# Patient Record
Sex: Male | Born: 1985 | Hispanic: Yes | Marital: Single | Smoking: Former smoker
Health system: Southern US, Community
[De-identification: ages and names within clinical notes are randomized; demographics above are authoritative.]

## PROBLEM LIST (undated history)

## (undated) DIAGNOSIS — E162 Hypoglycemia, unspecified: Secondary | ICD-10-CM

---

## 2012-08-14 ENCOUNTER — Emergency Department (HOSPITAL_COMMUNITY)
Admission: EM | Admit: 2012-08-14 | Discharge: 2012-08-14 | Disposition: A | Discharge status: Home / Self Care | Payer: Self-pay | Attending: Emergency Medicine | Admitting: Emergency Medicine

## 2012-08-14 ENCOUNTER — Encounter (HOSPITAL_COMMUNITY): Payer: Self-pay | Admitting: Emergency Medicine

## 2012-08-14 DIAGNOSIS — R509 Fever, unspecified: Secondary | ICD-10-CM | POA: Insufficient documentation

## 2012-08-14 DIAGNOSIS — R05 Cough: Secondary | ICD-10-CM | POA: Insufficient documentation

## 2012-08-14 DIAGNOSIS — J029 Acute pharyngitis, unspecified: Secondary | ICD-10-CM | POA: Insufficient documentation

## 2012-08-14 DIAGNOSIS — R059 Cough, unspecified: Secondary | ICD-10-CM | POA: Insufficient documentation

## 2012-08-14 DIAGNOSIS — R1310 Dysphagia, unspecified: Secondary | ICD-10-CM | POA: Insufficient documentation

## 2012-08-14 DIAGNOSIS — J019 Acute sinusitis, unspecified: Secondary | ICD-10-CM | POA: Insufficient documentation

## 2012-08-14 DIAGNOSIS — IMO0001 Reserved for inherently not codable concepts without codable children: Secondary | ICD-10-CM | POA: Insufficient documentation

## 2012-08-14 DIAGNOSIS — R599 Enlarged lymph nodes, unspecified: Secondary | ICD-10-CM | POA: Insufficient documentation

## 2012-08-14 HISTORY — DX: Hypoglycemia, unspecified: E16.2

## 2012-08-14 LAB — RAPID STREP SCREEN (MED CTR MEBANE ONLY): Streptococcus, Group A Screen (Direct): NEGATIVE

## 2012-08-14 MED ORDER — DEXAMETHASONE SODIUM PHOSPHATE 10 MG/ML IJ SOLN
10.0000 mg | Freq: Once | INTRAMUSCULAR | Status: AC
  Administered 2012-08-14: 10 mg via INTRAMUSCULAR
  Filled 2012-08-14: qty 1

## 2012-08-14 MED ORDER — HYDROCODONE-HOMATROPINE 5-1.5 MG/5ML PO SYRP: 2.5000 mL | ORAL_SOLUTION | Freq: Four times a day (QID) | ORAL | Status: DC | PRN

## 2012-08-14 MED ORDER — AZITHROMYCIN 250 MG PO TABS: ORAL_TABLET | ORAL | Status: DC

## 2012-08-14 NOTE — ED Notes (Signed)
Pt reports 2 week hx of sinus drainage, sore throat and cough. 2 day hx of eye drainage. Denies ear pain

## 2012-08-14 NOTE — ED Provider Notes (Signed)
History     CSN: [id number]  Arrival date & time 08/14/12  1236   First MD Initiated Contact with Patient 08/14/12 1252      Chief Complaint  Patient presents with  . Nasal Congestion    x 2 weeks  . Cough    (Consider location/radiation/quality/duration/timing/severity/associated sxs/prior treatment) HPI 27 year old male presents the emergency department with chief complaint of sinusitis.  Patient states that he developed flulike symptoms approximately 2 weeks ago with sore throat, body aches chills, subjective fever, nasal congestion, cough.  Over the past 2 days he has had increasing the worst pain in his face and nasal cavity.  He also has a very sore throat and swollen tissues with odynophagia.  Patient has subjective fevers and chills.  He is decreased activity level.   No past medical history on file.  No past surgical history on file.  No family history on file.  History  Substance Use Topics  . Smoking status: Not on file  . Smokeless tobacco: Not on file  . Alcohol Use: Not on file      Review of Systems Ten systems reviewed and are negative for acute change, except as noted in the HPI.   Allergies  Penicillins  Home Medications   Current Outpatient Rx  Name  Route  Sig  Dispense  Refill  . PSEUDOEPH-DOXYLAMINE-DM-APAP 60-7.12-05-998 MG/30ML PO LIQD   Oral   Take 30 mLs by mouth at bedtime as needed. Cold/flu         . PSEUDOEPHEDRINE HCL 30 MG PO TABS   Oral   Take 30 mg by mouth every 4 (four) hours as needed. Nasal congestion         . PSEUDOEPHEDRINE-APAP-DM 11-914-78 MG/30ML PO LIQD   Oral   Take 30 mLs by mouth every 6 (six) hours as needed. Cold/flu           There were no vitals taken for this visit.  Physical Exam  Physical Exam  Nursing note and vitals reviewed. Constitutional: He appears well-developed and well-nourished. No distress.  HENT:  Head: Normocephalic and atraumatic.  Eyes: Conjunctivae normal are normal. No  scleral icterus.  Ears: TMs normal Neck: Normal range of motion. Neck supple.  tender anterior cervical lymphadenopathy Throat: Edematous uvula midline.  Erythematous.  No exudates.  Airway patent. Cardiovascular: Normal rate, regular rhythm and normal heart sounds.   Pulmonary/Chest: Effort normal and breath sounds normal. No respiratory distress.  Abdominal: Soft. There is no tenderness.  Musculoskeletal: He exhibits no edema.  Neurological: He is alert.  Skin: Skin is warm and dry. He is not diaphoretic.  Psychiatric: His behavior is normal.    ED Course  Procedures (including critical care time)  Labs Reviewed - No data to display No results found.   1. Acute sinusitis       MDM  1:25 PM BP 121/91  Pulse 92  Temp 98.3 F (36.8 C)  Resp 18  SpO2 100% Sig with likely bacterial sinusitis 2 length of symptoms. Strep throat swab pending at this time .   2:04 PM BP 121/91  Pulse 92  Temp 98.3 F (36.8 C)  Resp 18  SpO2 100% MDM Number of Diagnoses or Management Options Acute sinusitis:     With bacterial sinusitis.  Given Decadron to decrease swelling of the pharyngeal tissues.  Precautions discussed.  Treated with Z-Pak.      Arthor Captain, PA-C 08/14/12 1406

## 2012-08-14 NOTE — ED Provider Notes (Signed)
Medical screening examination/treatment/procedure(s) were performed by non-physician practitioner and as supervising physician I was immediately available for consultation/collaboration.  Ethelda Chick, MD 08/14/12 1414

## 2012-12-15 ENCOUNTER — Emergency Department (HOSPITAL_COMMUNITY)
Admission: EM | Admit: 2012-12-15 | Discharge: 2012-12-15 | Disposition: A | Discharge status: Home / Self Care | Payer: 59 | Attending: Emergency Medicine | Admitting: Emergency Medicine

## 2012-12-15 ENCOUNTER — Emergency Department (HOSPITAL_COMMUNITY): Payer: 59

## 2012-12-15 ENCOUNTER — Encounter (HOSPITAL_COMMUNITY): Payer: Self-pay | Admitting: Emergency Medicine

## 2012-12-15 DIAGNOSIS — Y929 Unspecified place or not applicable: Secondary | ICD-10-CM | POA: Insufficient documentation

## 2012-12-15 DIAGNOSIS — Z8639 Personal history of other endocrine, nutritional and metabolic disease: Secondary | ICD-10-CM | POA: Insufficient documentation

## 2012-12-15 DIAGNOSIS — W010XXA Fall on same level from slipping, tripping and stumbling without subsequent striking against object, initial encounter: Secondary | ICD-10-CM | POA: Insufficient documentation

## 2012-12-15 DIAGNOSIS — Z862 Personal history of diseases of the blood and blood-forming organs and certain disorders involving the immune mechanism: Secondary | ICD-10-CM | POA: Insufficient documentation

## 2012-12-15 DIAGNOSIS — F17200 Nicotine dependence, unspecified, uncomplicated: Secondary | ICD-10-CM | POA: Insufficient documentation

## 2012-12-15 DIAGNOSIS — M25562 Pain in left knee: Secondary | ICD-10-CM

## 2012-12-15 DIAGNOSIS — Y9389 Activity, other specified: Secondary | ICD-10-CM | POA: Insufficient documentation

## 2012-12-15 DIAGNOSIS — S8990XA Unspecified injury of unspecified lower leg, initial encounter: Secondary | ICD-10-CM | POA: Insufficient documentation

## 2012-12-15 MED ORDER — HYDROCODONE-ACETAMINOPHEN 5-325 MG PO TABS: 1.0000 | ORAL_TABLET | ORAL | Status: DC | PRN
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
## 2012-12-15 NOTE — ED Provider Notes (Signed)
History     CSN: [id number]  Arrival date & time 12/15/12  2029   First MD Initiated Contact with Patient 12/15/12 2039      Chief Complaint  Patient presents with  . Knee Pain    (Consider location/radiation/quality/duration/timing/severity/associated sxs/prior treatment) Patient is a 27 y.o. male presenting with knee pain. The history is provided by the patient. No language interpreter was used.  Knee Pain Location:  Knee Injury: yes   Mechanism of injury: fall   Knee location:  L knee Pain details:    Quality:  Aching   Radiates to:  Does not radiate   Severity:  Moderate   Onset quality:  Gradual   Duration:  1 day Chronicity:  New Dislocation: no   Foreign body present:  No foreign bodies Prior injury to area:  No Worsened by:  Nothing tried Ineffective treatments:  None tried Associated symptoms: swelling   Associated symptoms: no back pain, no muscle weakness, no numbness and no stiffness     Past Medical History  Diagnosis Date  . Hypoglycemia   . Hypoglycemia     History reviewed. No pertinent past surgical history.  Family History  Problem Relation Age of Onset  . Hypertension Mother   . Diabetes Mother   . Hypertension Father   . Diabetes Father     History  Substance Use Topics  . Smoking status: Current Some Day Smoker  . Smokeless tobacco: Not on file  . Alcohol Use: Yes      Review of Systems  Constitutional: Negative.   Respiratory: Negative.   Cardiovascular: Negative.   Musculoskeletal: Negative for back pain and stiffness.    Allergies  Penicillins  Home Medications  No current outpatient prescriptions on file.  BP 125/65  Pulse 75  Temp(Src) 98.3 F (36.8 C) (Oral)  Resp 14  SpO2 100%  Physical Exam  Nursing note and vitals reviewed. Constitutional: He is oriented to person, place, and time. He appears well-developed and well-nourished.  Cardiovascular: Normal rate and regular rhythm.   Pulmonary/Chest: Effort  normal and breath sounds normal.  Musculoskeletal: Normal range of motion.  Pt has obvious swelling to the medial aspect of the left knee:pt has full rom  Neurological: He is alert and oriented to person, place, and time.  Skin: Skin is warm and dry.    ED Course  Procedures (including critical care time)  Labs Reviewed - No data to display Dg Knee Complete 4 Views Left  12/15/2012   *RADIOLOGY REPORT*  Clinical Data: Pain post fall.  LEFT KNEE - COMPLETE 4+ VIEW  Comparison: None.  Findings: No effusion. Negative for fracture, dislocation, or other acute abnormality.  Normal alignment and mineralization. No significant degenerative change.  Regional soft tissues unremarkable.  IMPRESSION:  Negative   Original Report Authenticated By: D. Andria Rhein, MD     1. Knee pain, left       MDM  Pt has no acute bony abnormality:pt is okay to follow up as needed        Teressa Lower, NP 12/15/12 2144

## 2012-12-16 NOTE — ED Provider Notes (Signed)
Medical screening examination/treatment/procedure(s) were performed by non-physician practitioner and as supervising physician I was immediately available for consultation/collaboration.   Lyndsy Gilberto, MD 12/16/12 0218 

## 2013-02-03 ENCOUNTER — Emergency Department (INDEPENDENT_AMBULATORY_CARE_PROVIDER_SITE_OTHER): Payer: 59

## 2013-02-03 ENCOUNTER — Encounter (HOSPITAL_COMMUNITY): Payer: Self-pay | Admitting: Emergency Medicine

## 2013-02-03 ENCOUNTER — Emergency Department (INDEPENDENT_AMBULATORY_CARE_PROVIDER_SITE_OTHER)
Admission: EM | Admit: 2013-02-03 | Discharge: 2013-02-03 | Disposition: A | Discharge status: Home / Self Care | Payer: 59 | Source: Home / Self Care | Attending: Family Medicine | Admitting: Family Medicine

## 2013-02-03 DIAGNOSIS — S139XXA Sprain of joints and ligaments of unspecified parts of neck, initial encounter: Secondary | ICD-10-CM

## 2013-02-03 DIAGNOSIS — S134XXA Sprain of ligaments of cervical spine, initial encounter: Secondary | ICD-10-CM

## 2013-02-03 MED ORDER — CYCLOBENZAPRINE HCL 10 MG PO TABS: 10.0000 mg | ORAL_TABLET | Freq: Two times a day (BID) | ORAL | Status: DC | PRN

## 2013-02-03 MED ORDER — HYDROCODONE-ACETAMINOPHEN 5-325 MG PO TABS: 1.0000 | ORAL_TABLET | Freq: Four times a day (QID) | ORAL | Status: DC | PRN

## 2013-02-03 MED ORDER — NAPROXEN 500 MG PO TABS: 500.0000 mg | ORAL_TABLET | Freq: Two times a day (BID) | ORAL | Status: DC
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
## 2013-02-03 NOTE — ED Provider Notes (Signed)
Medical screening examination/treatment/procedure(s) were performed by resident physician or non-physician practitioner and as supervising physician I was immediately available for consultation/collaboration.   Lamarr Feenstra DOUGLAS MD.   Nyal Schachter D Marian Grandt, MD 02/03/13 2110 

## 2013-04-01 ENCOUNTER — Encounter (HOSPITAL_COMMUNITY): Payer: Self-pay | Admitting: Emergency Medicine

## 2013-04-01 ENCOUNTER — Emergency Department (HOSPITAL_COMMUNITY)
Admission: EM | Admit: 2013-04-01 | Discharge: 2013-04-01 | Disposition: A | Discharge status: Home / Self Care | Payer: 59 | Attending: Emergency Medicine | Admitting: Emergency Medicine

## 2013-04-01 DIAGNOSIS — Z791 Long term (current) use of non-steroidal anti-inflammatories (NSAID): Secondary | ICD-10-CM | POA: Insufficient documentation

## 2013-04-01 DIAGNOSIS — J069 Acute upper respiratory infection, unspecified: Secondary | ICD-10-CM | POA: Insufficient documentation

## 2013-04-01 DIAGNOSIS — Z862 Personal history of diseases of the blood and blood-forming organs and certain disorders involving the immune mechanism: Secondary | ICD-10-CM | POA: Insufficient documentation

## 2013-04-01 DIAGNOSIS — F17200 Nicotine dependence, unspecified, uncomplicated: Secondary | ICD-10-CM | POA: Insufficient documentation

## 2013-04-01 DIAGNOSIS — Z8639 Personal history of other endocrine, nutritional and metabolic disease: Secondary | ICD-10-CM | POA: Insufficient documentation

## 2013-04-01 DIAGNOSIS — Z880 Allergy status to penicillin: Secondary | ICD-10-CM | POA: Insufficient documentation

## 2013-04-01 MED ORDER — ACETAMINOPHEN 325 MG PO TABS
650.0000 mg | ORAL_TABLET | Freq: Once | ORAL | Status: AC
  Administered 2013-04-01: 650 mg via ORAL
  Filled 2013-04-01: qty 2

## 2013-04-01 MED ORDER — GUAIFENESIN-DM 100-10 MG/5ML PO SYRP: 5.0000 mL | ORAL_SOLUTION | Freq: Three times a day (TID) | ORAL | Status: DC | PRN

## 2013-04-01 MED ORDER — IBUPROFEN 800 MG PO TABS
800.0000 mg | ORAL_TABLET | Freq: Once | ORAL | Status: AC
  Administered 2013-04-01: 800 mg via ORAL
  Filled 2013-04-01: qty 1

## 2013-04-01 NOTE — ED Provider Notes (Signed)
CSN: [id number]     Arrival date & time 04/01/13  1447 History  This chart was scribed for non-physician practitioner Jillyn Ledger, PA-C working with Ward Givens, MD by Valera Castle, ED scribe. This patient was seen in room WTR7/WTR7 and the patient's care was started at 4:01 PM.    Chief Complaint  Patient presents with  . URI    Patient is a 27 y.o. male presenting with URI. The history is provided by the patient. No language interpreter was used.  URI Presenting symptoms: congestion, cough, fever (subjective), rhinorrhea and sore throat   Presenting symptoms: no ear pain and no fatigue   Severity:  Moderate Onset quality:  Sudden Duration:  2 days Timing:  Constant Progression:  Worsening Chronicity:  New Associated symptoms: no headaches, no neck pain and no wheezing    HPI Comments: Johnny Montoya is a 27 y.o. male with a h/o hypoglycemia who presents to the Emergency Department complaining of a URI, onset 2 days ago with associated nasal congestion, subjective fever, and sore throat. He reports associated unproductive dry cough with no wheezing or SOB.  He describes a "scratchy throat" and denies any drooling, difficulty breathing/swallowing.  He denies taking a temperature for his fever. He reports having taken Ibuprofen, with no relief. He denies having a h/o asthma or other breathing problems. He denies anyone in proximity to him being ill. He denies chest pain, abdominal pain, ear pain, emesis, nausea, diarhea, and any other associated symptoms. He reports being an occasional smoker. He has an allergy to Penicillin, and denies any other medical history. He denies having a PCP.   Past Medical History  Diagnosis Date  . Hypoglycemia   . Hypoglycemia    History reviewed. No pertinent past surgical history. Family History  Problem Relation Age of Onset  . Hypertension Mother   . Diabetes Mother   . Hypertension Father   . Diabetes Father    History  Substance Use  Topics  . Smoking status: Current Some Day Smoker  . Smokeless tobacco: Not on file  . Alcohol Use: Yes    Review of Systems  Constitutional: Positive for fever (subjective). Negative for chills, activity change, appetite change and fatigue.  HENT: Positive for congestion, sore throat and rhinorrhea. Negative for ear pain, facial swelling, mouth sores, neck pain, neck stiffness and sinus pressure.   Eyes: Negative for visual disturbance.  Respiratory: Positive for cough. Negative for shortness of breath and wheezing.   Cardiovascular: Negative for chest pain and leg swelling.  Gastrointestinal: Negative for nausea, vomiting, abdominal pain, diarrhea and constipation.  Genitourinary: Negative for dysuria.  Musculoskeletal: Negative for back pain.  Skin: Negative for rash and wound.  Neurological: Negative for dizziness, weakness, light-headedness and headaches.  All other systems reviewed and are negative.    Allergies  Penicillins  Home Medications   Current Outpatient Rx  Name  Route  Sig  Dispense  Refill  . cyclobenzaprine (FLEXERIL) 10 MG tablet   Oral   Take 1 tablet (10 mg total) by mouth 2 (two) times daily as needed.   20 tablet   0   . HYDROcodone-acetaminophen (NORCO) 5-325 MG per tablet   Oral   Take 1 tablet by mouth every 6 (six) hours as needed for pain.   20 tablet   0   . HYDROcodone-acetaminophen (NORCO/VICODIN) 5-325 MG per tablet   Oral   Take 1 tablet by mouth every 4 (four) hours as needed for pain.  6 tablet   0   . ibuprofen (ADVIL,MOTRIN) 200 MG tablet   Oral   Take 200 mg by mouth every 6 (six) hours as needed for pain.         . naproxen (NAPROSYN) 500 MG tablet   Oral   Take 1 tablet (500 mg total) by mouth 2 (two) times daily.   30 tablet   0    Triage Vitals: BP 120/78  Temp(Src) 99.3 F (37.4 C) (Oral)  Resp 18  SpO2 96%  Filed Vitals:   04/01/13 1501 04/01/13 1629  BP: 120/78 113/70  Pulse:  90  Temp: 99.3 F (37.4  C)   TempSrc: Oral   Resp: 18 18  SpO2: 96% 98%     Physical Exam  Nursing note and vitals reviewed. Constitutional: He is oriented to person, place, and time. He appears well-developed and well-nourished. No distress.  HENT:  Head: Normocephalic and atraumatic.  Right Ear: External ear normal.  Left Ear: External ear normal.  Nose: Nose normal.  Mouth/Throat: Oropharynx is clear and moist. No oropharyngeal exudate.  TM's gray and translucent bilaterally.  Rhinorrhea. No trismus.  Uvula midline.    Eyes: Conjunctivae and EOM are normal. Pupils are equal, round, and reactive to light. Right eye exhibits no discharge. Left eye exhibits no discharge.  Neck: Normal range of motion. Neck supple. No tracheal deviation present.  No LAD, tenderness, or edema throughout the neck   Cardiovascular: Normal rate, regular rhythm, normal heart sounds and intact distal pulses.  Exam reveals no gallop and no friction rub.   No murmur heard. Pulmonary/Chest: Effort normal and breath sounds normal. No respiratory distress. He has no wheezes. He has no rales. He exhibits no tenderness.  Patient coughing throughout exam   Abdominal: Soft. Bowel sounds are normal. There is no tenderness.  Musculoskeletal: Normal range of motion. He exhibits no edema.  Neurological: He is alert and oriented to person, place, and time.  Skin: Skin is warm and dry. He is not diaphoretic.  Psychiatric: He has a normal mood and affect. His behavior is normal.    ED Course  Procedures (including critical care time)  DIAGNOSTIC STUDIES: Oxygen Saturation is 96% on room air, normal by my interpretation.    COORDINATION OF CARE: 4:07 PM-Discussed treatment plan with pt at bedside and pt agreed to plan. Recommended pt to get nasal decongestants. Will give pt a script for stronger cough medicine and give him a note for work.    Labs Review Labs Reviewed - No data to display Imaging Review No results found.  MDM  No  diagnosis found.  Johnny Montoya is a 27 y.o. male with a h/o hypoglycemia who presents to the Emergency Department complaining of a URI, onset 2 days ago with associated nasal congestion, subjective fever, and sore throat. Ibuprofen and Tylenol ordered for symptomatic relief.      Etiology of symptoms is likely due to a URI.  Patient was prescribed Robitussin for outpatient management.  Patient was non-toxic in appearance and afebrile.  He was instructed to follow-up with a PCP if his symptoms do not improve or worsen.  Patient provided resource guide.  Patient was instructed to return to the ED if they experience any fever, hemoptysis, stiff neck, SOB, or other concerns.  Patient was in agreement with discharge and plan.     Final impressions: 1. Upper respiratory infection    Thomasenia Sales    I personally performed the services  described in this documentation, which was scribed in my presence. The recorded information has been reviewed and is accurate.    Jillyn Ledger, PA-C 04/03/13 1219

## 2013-04-01 NOTE — ED Notes (Signed)
Per pt, cold symptoms for 2 days, nasal congestion, fever, sore throat

## 2013-04-04 NOTE — ED Provider Notes (Signed)
Medical screening examination/treatment/procedure(s) were performed by non-physician practitioner and as supervising physician I was immediately available for consultation/collaboration. Devoria Albe, MD, Armando Gang   Ward Givens, MD 04/04/13 [phone]

## 2013-06-10 ENCOUNTER — Ambulatory Visit (INDEPENDENT_AMBULATORY_CARE_PROVIDER_SITE_OTHER): Payer: 59 | Admitting: Physician Assistant

## 2013-06-10 VITALS — BP 118/76 | HR 80 | Temp 98.1°F | Resp 18 | Ht 66.0 in | Wt 138.6 lb

## 2013-06-10 DIAGNOSIS — Z880 Allergy status to penicillin: Secondary | ICD-10-CM

## 2013-06-10 DIAGNOSIS — Z113 Encounter for screening for infections with a predominantly sexual mode of transmission: Secondary | ICD-10-CM

## 2013-06-10 DIAGNOSIS — E162 Hypoglycemia, unspecified: Secondary | ICD-10-CM

## 2013-06-10 LAB — GLUCOSE, POCT (MANUAL RESULT ENTRY): POC Glucose: 84 mg/dL (ref 70–99)

## 2013-06-10 NOTE — Patient Instructions (Signed)
I will let you know when your labs are back and if we need to treat anything.  Use condoms with EVERY sexual encounter - this is the only way to protect yourself from infection.   Safe Sex Safe sex is about reducing the risk of giving or getting a sexually transmitted disease (STD). STDs are spread through sexual contact involving the genitals, mouth, or rectum. Some STDS can be cured and others cannot. Safe sex can also prevent unintended pregnancies.  SAFE SEX PRACTICES  Limit your sexual activity to only one partner who is only having sex with you.  Talk to your partner about their past partners, past STDs, and drug use.  Use a condom every time you have sexual intercourse. This includes vaginal, oral, and anal sexual activity. Both females and males should wear condoms during oral sex. Only use latex or polyurethane condoms and water-based lubricants. Petroleum-based lubricants or oils used to lubricate a condom will weaken the condom and increase the chance that it will break. The condom should be in place from the beginning to the end of sexual activity. Wearing a condom reduces, but does not completely eliminate, your risk of getting or giving a STD. STDs can be spread by contact with skin of surrounding areas.  Get vaccinated for hepatitis B and HPV.  Avoid alcohol and recreational drugs which can affect your judgement. You may forget to use a condom or participate in high-risk sex.  For females, avoid douching after sexual intercourse. Douching can spread an infection farther into the reproductive tract.  Check your body for signs of sores, blisters, rashes, or unusual discharge. See your caregiver if you notice any of these signs.  Avoid sexual contact if you have symptoms of an infection or are being treated for an STD. If you or your partner has herpes, avoid sexual contact when blisters are present. Use condoms at all other times.  See your caregiver for regular screenings,  examinations, and tests for STDs. Before having sex with a new partner, each of you should be screened for STDs and talk about the results with your partner. BENEFITS OF SAFE SEX   There is less of a chance of getting or giving an STD.  You can prevent unwanted or unintended pregnancies.  By discussing safer sex concerns with your partner, you may increase feelings of intimacy, comfort, trust, and honesty between the both of you. Document Released: 08/02/2004 Document Revised: 03/19/2012 Document Reviewed: 12/17/2011 [facility] Patient Information 2014 [place], [place].

## 2013-06-10 NOTE — Progress Notes (Signed)
   Subjective:    Patient ID: Johnny Montoya, male    DOB: 10-30-1985, 27 y.o.   MRN: [id number]  HPI   Johnny Montoya is a pleasant 27 yr old male here requesting STD testing.  He denies symptoms including penile discharge, dysuria, skin lesions.  He was last tested in July 2014, all negative.  Currently sexually active with 1 male partner.  Has been with this partner for 2 months.  They do not use condoms.  Last urinated 1-2 hours ago.  Additionally he is concerned for hypoglycemia.  Reports a history of this.  He would like is blood sugar checked today.  He is also interested in allergy testing to see if he is still allergic to penicillin   Review of Systems  Constitutional: Negative for fever and chills.  Respiratory: Negative.   Cardiovascular: Negative.   Gastrointestinal: Negative.   Genitourinary: Negative for dysuria, discharge, genital sores and testicular pain.  Musculoskeletal: Negative.   Skin: Negative.        Objective:   Physical Exam  Vitals reviewed. Constitutional: He is oriented to person, place, and time. He appears well-developed and well-nourished. No distress.  HENT:  Head: Normocephalic and atraumatic.  Eyes: Conjunctivae are normal. No scleral icterus.  Pulmonary/Chest: Effort normal.  Neurological: He is alert and oriented to person, place, and time.  Skin: Skin is warm and dry.  Psychiatric: He has a normal mood and affect. His behavior is normal.    Results for orders placed in visit on 06/10/13  GLUCOSE, POCT (MANUAL RESULT ENTRY)      Result Value Range   POC Glucose 84  70 - 99 mg/dl       Assessment & Plan:  Screen for STD (sexually transmitted disease) - Plan: GC/Chlamydia Probe Amp, RPR, HIV antibody  Hypoglycemia - Plan: POCT glucose (manual entry), Ambulatory referral to Endocrinology  Penicillin allergy - Plan: Ambulatory referral to Allergy  Johnny Montoya is a 27 yr old male here for STD testing - uriprobe, HIV, RPR sent.  He has  concern for hypoglycemia.  Blood glucose today is normal.  He would like to be further evaluated for hypoglycemia with formal testing - I have referred him to endocrinology for this.  Additionally he would like testing to see if he is allergic to penicillin - I have referred him to allergy for this.  Will follow up on labs and treat if necessary.  Pt to RTC as needs arise.   Loleta Dicker MHS, PA-C [facility] 12/3/20145:20 PM

## 2013-06-11 LAB — HIV ANTIBODY (ROUTINE TESTING W REFLEX): HIV: NONREACTIVE

## 2013-06-11 LAB — GC/CHLAMYDIA PROBE AMP: CT Probe RNA: NEGATIVE

## 2013-07-28 ENCOUNTER — Encounter: Payer: Self-pay | Admitting: *Deleted

## 2013-10-25 ENCOUNTER — Ambulatory Visit (INDEPENDENT_AMBULATORY_CARE_PROVIDER_SITE_OTHER): Payer: 59 | Admitting: Internal Medicine

## 2013-10-25 VITALS — BP 100/60 | HR 75 | Temp 98.0°F | Resp 16 | Ht 65.0 in | Wt 135.0 lb

## 2013-10-25 DIAGNOSIS — R1013 Epigastric pain: Secondary | ICD-10-CM

## 2013-10-25 DIAGNOSIS — Z029 Encounter for administrative examinations, unspecified: Secondary | ICD-10-CM

## 2013-10-25 LAB — POCT CBC
Granulocyte percent: 85.3 %G — AB (ref 37–80)
HCT, POC: 41.9 % — AB (ref 43.5–53.7)
HEMOGLOBIN: 13.8 g/dL — AB (ref 14.1–18.1)
Lymph, poc: 1.2 (ref 0.6–3.4)
MCH: 27.7 pg (ref 27–31.2)
MCHC: 32.9 g/dL (ref 31.8–35.4)
MCV: 84.1 fL (ref 80–97)
MID (CBC): 0.4 (ref 0–0.9)
MPV: 9.6 fL (ref 0–99.8)
POC Granulocyte: 9.6 — AB (ref 2–6.9)
POC LYMPH PERCENT: 10.9 %L (ref 10–50)
POC MID %: 3.8 %M (ref 0–12)
Platelet Count, POC: 310 10*3/uL (ref 142–424)
RBC: 4.98 M/uL (ref 4.69–6.13)
RDW, POC: 13 %
WBC: 11.2 10*3/uL — AB (ref 4.6–10.2)

## 2013-10-25 MED ORDER — ONDANSETRON HCL 4 MG PO TABS: 4.0000 mg | ORAL_TABLET | Freq: Three times a day (TID) | ORAL | Status: DC | PRN

## 2013-10-25 MED ORDER — DICYCLOMINE HCL 20 MG PO TABS: 20.0000 mg | ORAL_TABLET | Freq: Four times a day (QID) | ORAL | Status: DC

## 2013-10-25 NOTE — Progress Notes (Addendum)
Subjective:    Patient ID: Johnny Montoya, male    DOB: 06/12/1986, 28 y.o.   MRN: [id number]  HPI This chart was scribed for Ellamae Siaobert Stephonie Wilcoxen, MD by Andrew Auaven Small, ED Scribe. This patient was seen in room 10 and the patient's care was started at 5:54 PM.  HPI Comments: Johnny Montoya is a 28 y.o. male who presents to the [facility] complaining of upper abdominal pain onset this morning. Pt reports that he was unable to stay at work all day  today due to intermittent nausea and emesis. Pt reports that he was unable to eat without throwing up earlier but that the nausea has resolved . At this time pt denies being nauseous, however the epigastric pain continues. Pt denies diarrhea, fever, cough, sore throat, dysuria. He has a history of dyspepsia only with spicy foods and no consistent reflux esophagitis  No current medical problems or medications   Allergies  Allergen Reactions  . Penicillins Rash    Face swells   Prior to Admission medications   Not on File   Review of Systems  Constitutional: Negative for fever and chills.  HENT: Negative for sore throat.   Respiratory: Negative for cough.   Gastrointestinal: Positive for nausea, vomiting and abdominal pain. Negative for diarrhea, constipation, blood in stool and abdominal distention.  Genitourinary: Negative for dysuria, urgency, frequency, flank pain, discharge and genital sores.       SSP with recent testing for STDs all negative/no current symptoms      Objective:   Physical Exam  Nursing note and vitals reviewed. Constitutional: He is oriented to person, place, and time. He appears well-developed and well-nourished. No distress.  HENT:  Head: Normocephalic and atraumatic.  Eyes: EOM are normal.  Neck: Neck supple.  Cardiovascular: Normal rate, regular rhythm and normal heart sounds.   No murmur heard. Pulmonary/Chest: Effort normal and breath sounds normal. No respiratory distress.  Abdominal: Soft.  Bowel sounds are normal. He exhibits no mass. There is no rebound and no guarding.  He has tenderness in the epigastric area to palpation No hepatomegaly or splenomegaly Nontender in both lower quadrants  Musculoskeletal: Normal range of motion.  Neurological: He is alert and oriented to person, place, and time.  Skin: Skin is warm and dry.  Psychiatric: He has a normal mood and affect. His behavior is normal.   Results for orders placed in visit on 10/25/13  POCT CBC      Result Value Ref Range   WBC 11.2 (*) 4.6 - 10.2 K/uL   Lymph, poc 1.2  0.6 - 3.4   POC LYMPH PERCENT 10.9  10 - 50 %L   MID (cbc) 0.4  0 - 0.9   POC MID % 3.8  0 - 12 %M   POC Granulocyte 9.6 (*) 2 - 6.9   Granulocyte percent 85.3 (*) 37 - 80 %G   RBC 4.98  4.69 - 6.13 M/uL   Hemoglobin 13.8 (*) 14.1 - 18.1 g/dL   HCT, POC 40.941.9 (*) 81.143.5 - 53.7 %   MCV 84.1  80 - 97 fL   MCH, POC 27.7  27 - 31.2 pg   MCHC 32.9  31.8 - 35.4 g/dL   RDW, POC 91.413.0     Platelet Count, POC 310  142 - 424 K/uL   MPV 9.6  0 - 99.8 fL       Assessment & Plan:  Epigastric abdominal pain with nausea Leukocytosis-mild  Will treat  with anti-medics and anti-spasmodic and followe in case he progresses to right lower quadrant pain indicating the need to rule out appendicitis Meds ordered this encounter  Medications  . dicyclomine (BENTYL) 20 MG tablet    Sig: Take 1 tablet (20 mg total) by mouth every 6 (six) hours. For abdominal pain    Dispense:  12 tablet    Refill:  0  . ondansetron (ZOFRAN) 4 MG tablet    Sig: Take 1 tablet (4 mg total) by mouth every 8 (eight) hours as needed for nausea or vomiting.    Dispense:  6 tablet    Refill:  0

## 2014-11-02 ENCOUNTER — Ambulatory Visit (INDEPENDENT_AMBULATORY_CARE_PROVIDER_SITE_OTHER): Payer: 59 | Admitting: Family Medicine

## 2014-11-02 ENCOUNTER — Ambulatory Visit (INDEPENDENT_AMBULATORY_CARE_PROVIDER_SITE_OTHER): Payer: 59

## 2014-11-02 VITALS — BP 104/60 | HR 129 | Temp 100.0°F | Resp 20 | Ht 66.0 in | Wt 138.0 lb

## 2014-11-02 DIAGNOSIS — E860 Dehydration: Secondary | ICD-10-CM

## 2014-11-02 DIAGNOSIS — G43A0 Cyclical vomiting, not intractable: Secondary | ICD-10-CM

## 2014-11-02 DIAGNOSIS — R1032 Left lower quadrant pain: Secondary | ICD-10-CM

## 2014-11-02 DIAGNOSIS — R000 Tachycardia, unspecified: Secondary | ICD-10-CM

## 2014-11-02 DIAGNOSIS — R509 Fever, unspecified: Secondary | ICD-10-CM | POA: Diagnosis not present

## 2014-11-02 DIAGNOSIS — R1115 Cyclical vomiting syndrome unrelated to migraine: Secondary | ICD-10-CM

## 2014-11-02 DIAGNOSIS — K529 Noninfective gastroenteritis and colitis, unspecified: Secondary | ICD-10-CM

## 2014-11-02 DIAGNOSIS — R319 Hematuria, unspecified: Secondary | ICD-10-CM

## 2014-11-02 DIAGNOSIS — R400 Somnolence: Secondary | ICD-10-CM | POA: Diagnosis not present

## 2014-11-02 LAB — POCT UA - MICROSCOPIC ONLY
BACTERIA, U MICROSCOPIC: NEGATIVE
CASTS, UR, LPF, POC: NEGATIVE
CRYSTALS, UR, HPF, POC: NEGATIVE
EPITHELIAL CELLS, URINE PER MICROSCOPY: NEGATIVE
MUCUS UA: POSITIVE
WBC, Ur, HPF, POC: NEGATIVE
Yeast, UA: NEGATIVE

## 2014-11-02 LAB — POCT URINALYSIS DIPSTICK
Bilirubin, UA: NEGATIVE
Glucose, UA: NEGATIVE
Ketones, UA: 80
LEUKOCYTES UA: NEGATIVE
Nitrite, UA: NEGATIVE
Protein, UA: 30
SPEC GRAV UA: 1.02
UROBILINOGEN UA: 0.2
pH, UA: 6

## 2014-11-02 LAB — COMPREHENSIVE METABOLIC PANEL
ALK PHOS: 53 U/L (ref 39–117)
ALT: 24 U/L (ref 0–53)
AST: 19 U/L (ref 0–37)
Albumin: 4.7 g/dL (ref 3.5–5.2)
BUN: 14 mg/dL (ref 6–23)
CO2: 20 mEq/L (ref 19–32)
CREATININE: 1.07 mg/dL (ref 0.50–1.35)
Calcium: 9.7 mg/dL (ref 8.4–10.5)
Chloride: 101 mEq/L (ref 96–112)
Glucose, Bld: 121 mg/dL — ABNORMAL HIGH (ref 70–99)
POTASSIUM: 4 meq/L (ref 3.5–5.3)
SODIUM: 136 meq/L (ref 135–145)
Total Bilirubin: 0.8 mg/dL (ref 0.2–1.2)
Total Protein: 7.7 g/dL (ref 6.0–8.3)

## 2014-11-02 LAB — POCT CBC
GRANULOCYTE PERCENT: 94.1 % — AB (ref 37–80)
HEMATOCRIT: 44.9 % (ref 43.5–53.7)
Hemoglobin: 14.5 g/dL (ref 14.1–18.1)
LYMPH, POC: 0.8 (ref 0.6–3.4)
MCH, POC: 26.7 pg — AB (ref 27–31.2)
MCHC: 32.2 g/dL (ref 31.8–35.4)
MCV: 82.8 fL (ref 80–97)
MID (CBC): 0.1 (ref 0–0.9)
MPV: 7.4 fL (ref 0–99.8)
POC GRANULOCYTE: 13.9 — AB (ref 2–6.9)
POC LYMPH %: 5.1 % — AB (ref 10–50)
POC MID %: 0.8 %M (ref 0–12)
Platelet Count, POC: 289 10*3/uL (ref 142–424)
RBC: 5.42 M/uL (ref 4.69–6.13)
RDW, POC: 13.8 %
WBC: 14.8 10*3/uL — AB (ref 4.6–10.2)

## 2014-11-02 LAB — POCT INFLUENZA A/B
INFLUENZA A, POC: NEGATIVE
INFLUENZA B, POC: NEGATIVE

## 2014-11-02 LAB — CK: Total CK: 173 U/L (ref 7–232)

## 2014-11-02 LAB — TSH: TSH: 0.171 u[IU]/mL — ABNORMAL LOW (ref 0.350–4.500)

## 2014-11-02 LAB — POCT SEDIMENTATION RATE: POCT SED RATE: 14 mm/h (ref 0–22)

## 2014-11-02 LAB — GLUCOSE, POCT (MANUAL RESULT ENTRY): POC GLUCOSE: 128 mg/dL — AB (ref 70–99)

## 2014-11-02 LAB — LIPASE: Lipase: 17 U/L (ref 0–75)

## 2014-11-02 MED ORDER — ACETAMINOPHEN 325 MG PO TABS
1000.0000 mg | ORAL_TABLET | Freq: Once | ORAL | Status: AC
  Administered 2014-11-02: 975 mg via ORAL

## 2014-11-02 MED ORDER — ACETAMINOPHEN 500 MG PO TABS: 1000.0000 mg | ORAL_TABLET | Freq: Three times a day (TID) | ORAL | Status: AC | PRN

## 2014-11-02 MED ORDER — ONDANSETRON 4 MG PO TBDP
8.0000 mg | ORAL_TABLET | Freq: Once | ORAL | Status: AC
  Administered 2014-11-02: 8 mg via ORAL

## 2014-11-02 MED ORDER — ONDANSETRON HCL 4 MG PO TABS: 4.0000 mg | ORAL_TABLET | Freq: Three times a day (TID) | ORAL | Status: AC | PRN

## 2014-11-02 MED ORDER — SODIUM CHLORIDE 0.9 % IV BOLUS (SEPSIS): 2000.0000 mL | Freq: Once | INTRAVENOUS | Status: AC

## 2014-11-02 NOTE — Progress Notes (Signed)
11/02/2014 at 3:14 PM  Johnny Montoya / DOB: 01/22/1986 / MRN: [id number]  The patient has SSP on his problem list.  SUBJECTIVE  Chief complaint: Abdominal Pain; Emesis; and Diarrhea   Johnny Montoya is a 29 y.o. male complaining of mild  Cramping RUQ and RLQ pain that started 1 day ago. Associated symptoms include anorexia, chills, diarrhea, fever, headache, nausea, sweats and vomiting and the patient denies hematochezia and hematemesis.  Treatments tried acetaminophen and NSAID's with poor relief. He denies eating anything abnormal and a MSM and denies any new sexual partners as well as promiscuity. He reports a history of hypoglycemia.  He works in [employer] and reports that one of his coworkers has had similar symptoms in the last week.    He  has a past medical history of Hypoglycemia; Hypoglycemia; and Hypoglycemia.    Medications reviewed and updated by myself where necessary, and exist elsewhere in the encounter.   Johnny Montoya is allergic to penicillins. He  reports that he has quit smoking. He does not have any smokeless tobacco history on file. He reports that he drinks alcohol. He reports that he does not use illicit drugs. He  has no sexual activity history on file. The patient  has no past surgical history on file.  His family history includes Diabetes in his father and mother; Hypertension in his father and mother.  Review of Systems  Constitutional: Positive for fever, chills and malaise/fatigue.  HENT: Negative for congestion.   Eyes: Negative.   Respiratory: Negative.  Negative for cough and shortness of breath.   Cardiovascular: Negative for chest pain and palpitations.  Gastrointestinal: Positive for nausea, vomiting, abdominal pain and diarrhea. Negative for heartburn, constipation and blood in stool.  Genitourinary: Negative.   Musculoskeletal: Positive for myalgias.  Skin: Negative for rash.  Neurological: Positive for dizziness. Negative for headaches.    Endo/Heme/Allergies: Negative for polydipsia.  Psychiatric/Behavioral: Negative for substance abuse.    OBJECTIVE  His  height is 5\' 6"  (1.676 m) and weight is 138 lb (62.596 kg). His oral temperature is 99.1 F (37.3 C). His blood pressure is 102/70 and his pulse is 128. His respiration is 20 and oxygen saturation is 97%.  The patient's body mass index is 22.28 kg/(m^2).  Physical Exam  Constitutional: He is oriented to person, place, and time. He appears well-developed and well-nourished. He appears lethargic.  Non-toxic appearance. He does not have a sickly appearance. He appears ill. He appears distressed.  HENT:  Right Ear: Hearing and tympanic membrane normal.  Left Ear: Hearing and tympanic membrane normal.  Nose: Nose normal. No mucosal edema.  Mouth/Throat: Uvula is midline and oropharynx is clear and moist. Mucous membranes are dry and not cyanotic.  Eyes: Conjunctivae and EOM are normal. Pupils are equal, round, and reactive to light. No scleral icterus.  Neck: Trachea normal and normal range of motion.  Cardiovascular: Regular rhythm.  Tachycardia present.  Exam reveals no gallop and no friction rub.   No murmur heard. Respiratory: Breath sounds normal. No accessory muscle usage. No respiratory distress.  GI: Normal appearance. He exhibits no distension and no mass. There is tenderness in the epigastric area, left upper quadrant and left lower quadrant. There is no rigidity, no guarding, no CVA tenderness, no tenderness at McBurney's point and negative Murphy's sign.  Musculoskeletal: Normal range of motion.  Lymphadenopathy:       Head (right side): No submental, no submandibular, no tonsillar, no preauricular, no posterior auricular and no  occipital adenopathy present.       Head (left side): No submental, no submandibular, no tonsillar, no preauricular, no posterior auricular and no occipital adenopathy present.    He has no cervical adenopathy.  Neurological: He is  oriented to person, place, and time. He appears lethargic. No cranial nerve deficit or sensory deficit. Gait normal. GCS eye subscore is 4. GCS verbal subscore is 5. GCS motor subscore is 6.  Skin: Skin is warm, dry and intact. He is not diaphoretic.  Psychiatric: His mood appears anxious. His speech is not rapid and/or pressured.    Results for orders placed or performed in visit on 11/02/14 (from the past 24 hour(s))  POCT glucose (manual entry)     Status: Abnormal   Collection Time: 11/02/14  1:13 PM  Result Value Ref Range   POC Glucose 128 (A) 70 - 99 mg/dl  POCT CBC     Status: Abnormal   Collection Time: 11/02/14  1:38 PM  Result Value Ref Range   WBC 14.8 (A) 4.6 - 10.2 K/uL   Lymph, poc 0.8 0.6 - 3.4   POC LYMPH PERCENT 5.1 (A) 10 - 50 %L   MID (cbc) 0.1 0 - 0.9   POC MID % 0.8 0 - 12 %M   POC Granulocyte 13.9 (A) 2 - 6.9   Granulocyte percent 94.1 (A) 37 - 80 %G   RBC 5.42 4.69 - 6.13 M/uL   Hemoglobin 14.5 14.1 - 18.1 g/dL   HCT, POC 96.2 95.2 - 53.7 %   MCV 82.8 80 - 97 fL   MCH, POC 26.7 (A) 27 - 31.2 pg   MCHC 32.2 31.8 - 35.4 g/dL   RDW, POC 84.1 %   Platelet Count, POC 289 142 - 424 K/uL   MPV 7.4 0 - 99.8 fL  POCT Influenza A/B     Status: None   Collection Time: 11/02/14  2:19 PM  Result Value Ref Range   Influenza A, POC Negative    Influenza B, POC Negative   POCT SEDIMENTATION RATE     Status: None   Collection Time: 11/02/14  2:40 PM  Result Value Ref Range   POCT SED RATE 14 0 - 22 mm/hr  POCT urinalysis dipstick     Status: None   Collection Time: 11/02/14  3:07 PM  Result Value Ref Range   Color, UA yellow    Clarity, UA clear    Glucose, UA neg    Bilirubin, UA neg    Ketones, UA 80    Spec Grav, UA 1.020    Blood, UA moderate    pH, UA 6.0    Protein, UA 30    Urobilinogen, UA 0.2    Nitrite, UA neg    Leukocytes, UA Negative   POCT UA - Microscopic Only     Status: None   Collection Time: 11/02/14  3:07 PM  Result Value Ref Range    WBC, Ur, HPF, POC neg    RBC, urine, microscopic 7-10    Bacteria, U Microscopic neg    Mucus, UA pos    Epithelial cells, urine per micros neg    Crystals, Ur, HPF, POC neg    Casts, Ur, LPF, POC neg    Yeast, UA neg    Orthostatic VS for the past 24 hrs:  BP- Lying Pulse- Lying BP- Sitting Pulse- Sitting BP- Standing at 0 minutes Pulse- Standing at 0 minutes  11/02/14 1700 99/61 mmHg 102 100/64 mmHg  106 99/65 mmHg 108  11/02/14 1420 126/79 mmHg 122 123/73 mmHg 120 105/71 mmHg 123   [facility] reading (PRIMARY) by  Dr. Clelia Croft: Chest negative for cardiopulmonary abnormalities. Abdomin negative for air fluid levels and dilated loops of bowel.  Can not rule out hepatomegaly.   ASSESSMENT & PLAN  Journee was seen today for abdominal pain, emesis and diarrhea.  Diagnoses and all orders for this visit:  Drowsiness Orders: -     POCT glucose (manual entry)  Left lower quadrant pain: Resolved at discharge.  Patient responded well to fluids and denied belly pain upon discharge.   Orders: -     POCT SEDIMENTATION RATE -     POCT CBC -     Comprehensive metabolic panel -     POCT urinalysis dipstick -     POCT UA - Microscopic Only -     TSH -     ondansetron (ZOFRAN-ODT) disintegrating tablet 8 mg; Take 2 tablets (8 mg total) by mouth once.  Febrile illness: Likely viral or bacterial gastroenteritis.  Of note, patient was severely dehydrated and required 2.3 liters of normal saline before his vitals stabilized.  Awaiting results of HIV, stool culture, and CMET. Patient to follow up tomorrow. Orders: -     HIV antibody (with reflex) -     POCT Influenza A/B -     acetaminophen (TYLENOL) tablet 975 mg; Take 3 tablets (975 mg total) by mouth once. -     Ibuprofen 1000 given in error.   Gastroenteritis: Managed with the above.   Tachycardia Orders: -     sodium chloride 0.9 % bolus 2,000 mL; Inject 2,300 mLs into the vein once.     The patient was advised to call or come back to clinic  if he does not see an improvement in symptoms, or worsens with the above plan.   Deliah Boston, MHS, PA-C [facility] 11/02/2014 3:14 PM

## 2014-11-03 ENCOUNTER — Ambulatory Visit (INDEPENDENT_AMBULATORY_CARE_PROVIDER_SITE_OTHER): Payer: 59 | Admitting: Physician Assistant

## 2014-11-03 VITALS — BP 98/70 | HR 101 | Temp 98.3°F | Resp 17 | Ht 67.0 in | Wt 137.0 lb

## 2014-11-03 DIAGNOSIS — Z09 Encounter for follow-up examination after completed treatment for conditions other than malignant neoplasm: Secondary | ICD-10-CM | POA: Diagnosis not present

## 2014-11-03 LAB — HIV ANTIBODY (ROUTINE TESTING W REFLEX): HIV 1&2 Ab, 4th Generation: NONREACTIVE

## 2014-11-03 NOTE — Progress Notes (Signed)
Pt assessed independently by myself, reviewed labs, EKG, xray.  Reviewed documentation and agree w/ excellent assessment and plan.  RTC tomorrow to see Chestine Sporelark or I will be available in for the next week as well to f/u. Norberto SorensonEva Shaw, MD MPH

## 2014-11-03 NOTE — Progress Notes (Signed)
11/03/2014 at 4:22 PM  Johnny Montoya / DOB: 07-21-1985 / MRN: [id number]  The patient has SSP on his problem list.  SUBJECTIVE  Chief complaint: Follow-up   Pt here for follow up of abdominal pain treated with 8 zofran, 1000 mg of tylenol, and 2.3 liters of normal saline yesterday.  Since that treatment he has been able to eat crackers and some oatmeal, and has been hydrating veinously with water.  He denies fever, and reports he feels much better over all. He has returned his self collect stool culture to [facility] today. He would like to try immodium for diarrhea.    He  has a past medical history of Hypoglycemia; Hypoglycemia; and Hypoglycemia.    Medications reviewed and updated by myself where necessary, and exist elsewhere in the encounter.   Mr. Monestime is allergic to penicillins. He  reports that he has quit smoking. He does not have any smokeless tobacco history on file. He reports that he drinks alcohol. He reports that he does not use illicit drugs. He  has no sexual activity history on file. The patient  has no past surgical history on file.  His family history includes Diabetes in his father and mother; Hypertension in his father and mother.  Review of Systems  Constitutional: Negative for fever.  Cardiovascular: Negative for chest pain.  Gastrointestinal: Positive for diarrhea. Negative for heartburn, nausea, vomiting, abdominal pain, constipation, blood in stool and melena.  Genitourinary: Negative.   Musculoskeletal: Negative for myalgias.  Neurological: Negative for dizziness and headaches.    OBJECTIVE  His  height is  (1.702 m) and weight is 137 lb (62.143 kg). His oral temperature is 98.3 F (36.8 C). His blood pressure is 98/70 and his pulse is 101. His respiration is 17 and oxygen saturation is 98%.  The patient's body mass index is 21.45 kg/(m^2).  Physical Exam  Vitals reviewed. Cardiovascular: Normal rate.   Respiratory: Effort normal.  GI: Soft. Bowel  sounds are normal. He exhibits no distension and no mass. There is no tenderness. There is no rebound and no guarding.    Results for orders placed or performed in visit on 11/02/14 (from the past 24 hour(s))  CK     Status: None   Collection Time: 11/02/14  4:31 PM  Result Value Ref Range   Total CK 173 7 - 232 U/L   Narrative   Performed at:  [facility]                [address]                [place], [place] [address]  Lipase     Status: None   Collection Time: 11/02/14  5:19 PM  Result Value Ref Range   Lipase 17 0 - 75 U/L   Narrative   Performed at:  [facility]                [address], [address]                [place], [place] [address]    ASSESSMENT & PLAN  Johnny Montoya was seen today for follow-up.  Diagnoses and all orders for this visit:  Follow-up examination: Patient drastically improved from yesterday's presentation.  Complaining of continued diarrhea. Advised against imodium as this illness should be given the oppurtunity to fully run its course and exit the GI tract.  He is to call or come back should  his symptoms persist longer than three more days.     The patient was advised to call or come back to clinic if he does not see an improvement in symptoms, or worsens with the above plan.   Johnny Montoya, MHS, PA-C [facility] 11/03/2014 4:22 PM

## 2014-11-07 LAB — STOOL CULTURE

## 2016-09-16 IMAGING — CR DG ABDOMEN ACUTE W/ 1V CHEST
3 series · 3 of 3 positions shown · non-contrast
Comparison: 02/03/2013

CLINICAL DATA: Right upper and lower quadrant abdominal pain for 1
day with anorexia, chills, diarrhea and fever.

EXAM:
DG ABDOMEN ACUTE W/ 1V CHEST

[PA]
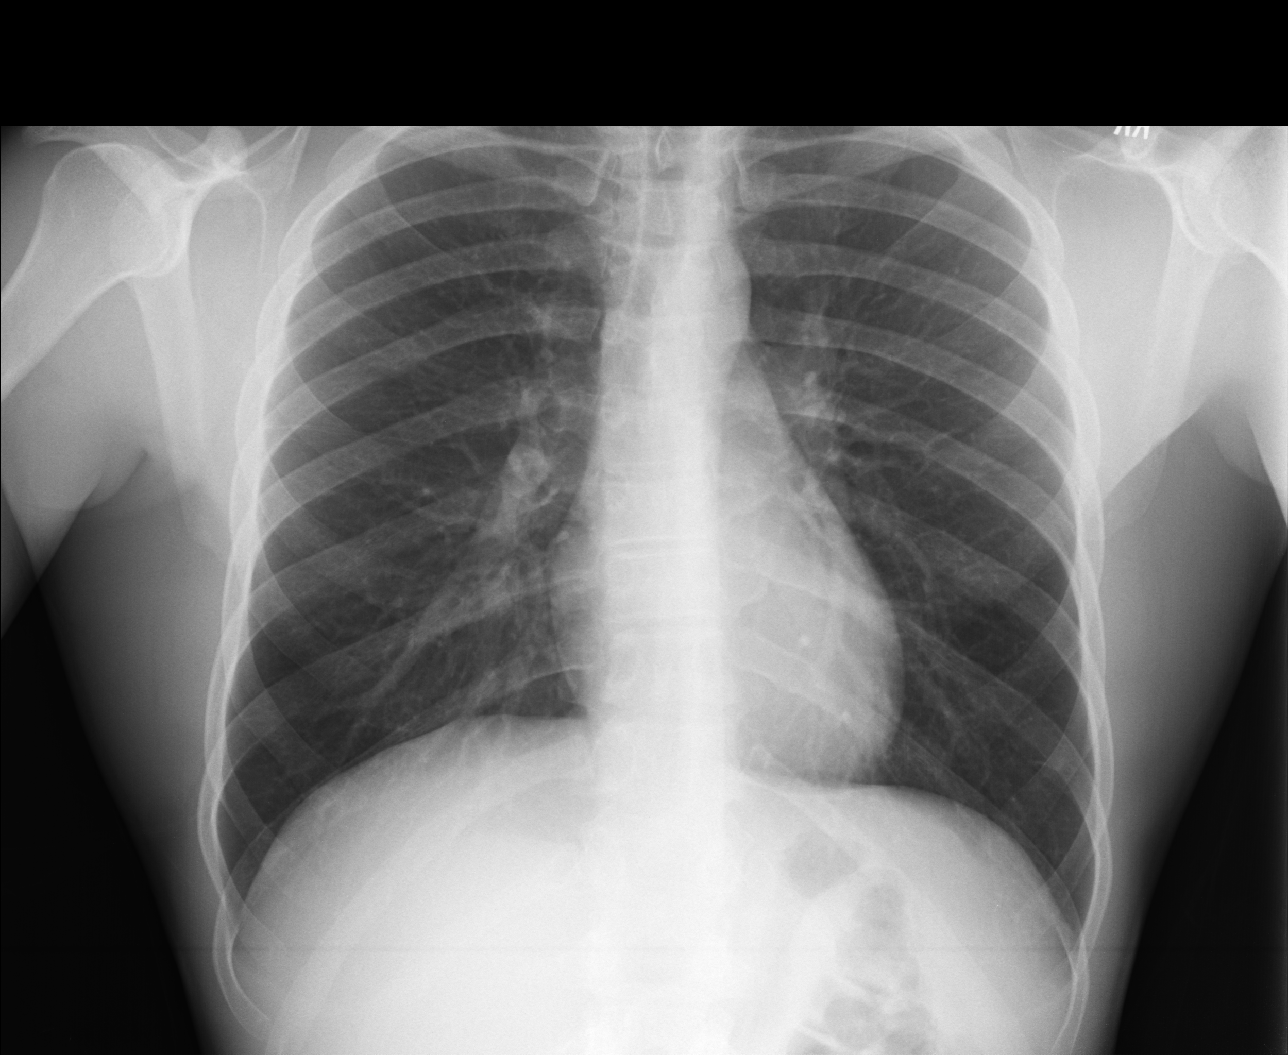

[AP (1 of 2)]
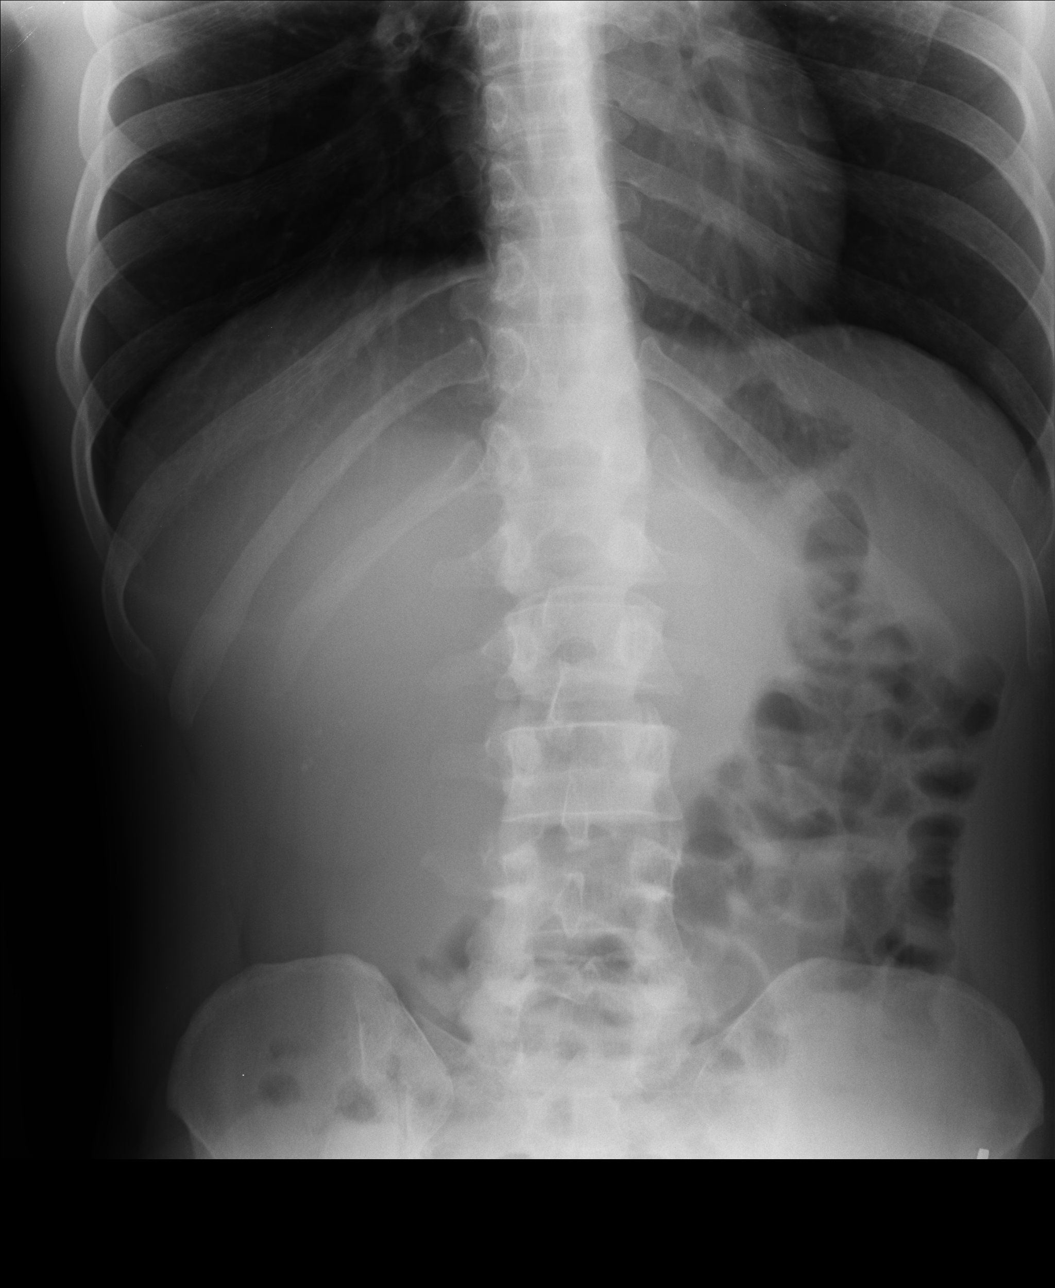

[AP (2 of 2)]
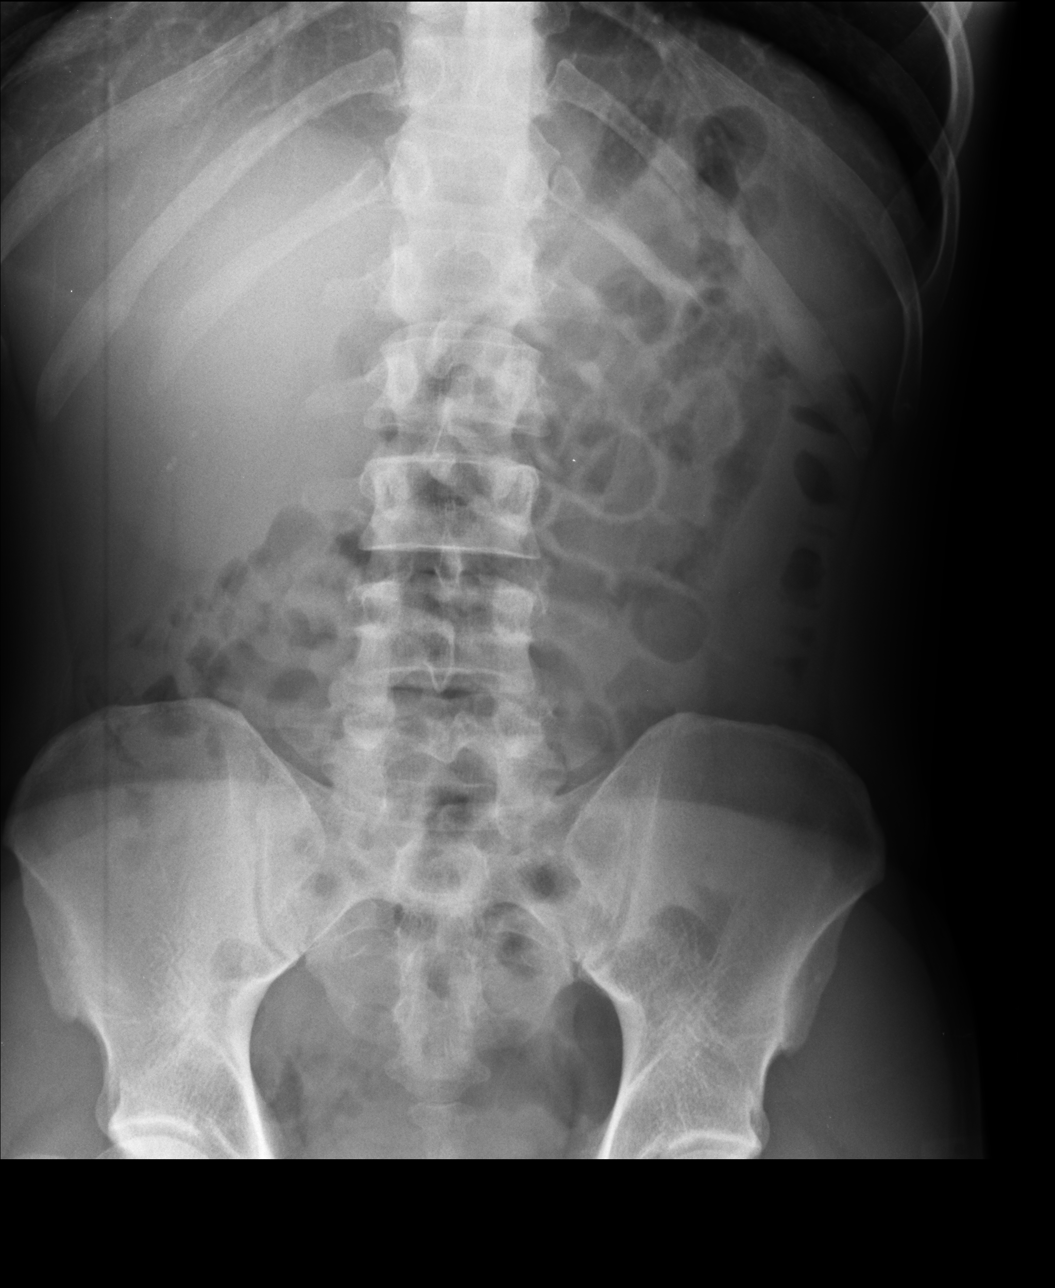

[3 of 3 positions shown; findings below may reference images not displayed]

FINDINGS: Normal heart size and vascularity. Lungs remain clear. No focal
pneumonia, collapse or consolidation. No edema, effusion, or
pneumothorax. Trachea midline.

No free air evident. Scattered air and stool throughout the bowel.
Negative for obstruction. Small punctate calculi in the right
abdomen, consistent with right lower pole renal calculi. No acute
osseous finding.
IMPRESSION: No acute chest process

Negative for obstruction or free air

Punctate right nephrolithiasis.
